# Patient Record
Sex: Male | Born: 1985 | Race: Black or African American | Hispanic: No | Marital: Single | Smoking: Former smoker
Health system: Southern US, Community
[De-identification: ages and names within clinical notes are randomized; demographics above are authoritative.]

---
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
## 2003-06-15 ENCOUNTER — Emergency Department (HOSPITAL_COMMUNITY): Admission: AD | Admit: 2003-06-15 | Discharge: 2003-06-15 | Payer: Self-pay | Admitting: Family Medicine

## 2005-04-25 ENCOUNTER — Emergency Department (HOSPITAL_COMMUNITY): Admission: EM | Admit: 2005-04-25 | Discharge: 2005-04-25 | Payer: Self-pay | Admitting: Family Medicine

## 2005-05-11 ENCOUNTER — Emergency Department (HOSPITAL_COMMUNITY): Admission: EM | Admit: 2005-05-11 | Discharge: 2005-05-11 | Payer: Self-pay | Admitting: Family Medicine

## 2005-07-16 ENCOUNTER — Emergency Department (HOSPITAL_COMMUNITY): Admission: EM | Admit: 2005-07-16 | Discharge: 2005-07-16 | Payer: Self-pay | Admitting: Family Medicine

## 2005-09-03 ENCOUNTER — Emergency Department (HOSPITAL_COMMUNITY): Admission: EM | Admit: 2005-09-03 | Discharge: 2005-09-03 | Payer: Self-pay | Admitting: Family Medicine

## 2007-03-27 ENCOUNTER — Emergency Department (HOSPITAL_COMMUNITY): Admission: EM | Admit: 2007-03-27 | Discharge: 2007-03-27 | Payer: Self-pay | Admitting: Emergency Medicine

## 2008-11-15 ENCOUNTER — Emergency Department (HOSPITAL_COMMUNITY): Admission: EM | Admit: 2008-11-15 | Discharge: 2008-11-15 | Payer: Self-pay | Admitting: Emergency Medicine

## 2008-11-28 ENCOUNTER — Emergency Department (HOSPITAL_COMMUNITY): Admission: EM | Admit: 2008-11-28 | Discharge: 2008-11-28 | Payer: Self-pay | Admitting: Family Medicine

## 2011-02-19 ENCOUNTER — Emergency Department (HOSPITAL_COMMUNITY)
Admission: EM | Admit: 2011-02-19 | Discharge: 2011-02-19 | Disposition: A | Discharge status: Home / Self Care | Payer: Self-pay | Attending: Emergency Medicine | Admitting: Emergency Medicine

## 2011-02-19 ENCOUNTER — Emergency Department (HOSPITAL_COMMUNITY): Payer: Self-pay

## 2011-02-19 DIAGNOSIS — S52123A Displaced fracture of head of unspecified radius, initial encounter for closed fracture: Secondary | ICD-10-CM | POA: Insufficient documentation

## 2011-02-19 DIAGNOSIS — M25529 Pain in unspecified elbow: Secondary | ICD-10-CM | POA: Insufficient documentation

## 2012-03-02 ENCOUNTER — Emergency Department (HOSPITAL_COMMUNITY)
Admission: EM | Admit: 2012-03-02 | Discharge: 2012-03-02 | Disposition: A | Discharge status: Home / Self Care | Payer: Self-pay | Source: Home / Self Care

## 2012-03-02 ENCOUNTER — Encounter (HOSPITAL_COMMUNITY): Payer: Self-pay

## 2012-03-02 DIAGNOSIS — K047 Periapical abscess without sinus: Secondary | ICD-10-CM

## 2012-03-02 DIAGNOSIS — H109 Unspecified conjunctivitis: Secondary | ICD-10-CM

## 2012-03-02 MED ORDER — HYDROCODONE-ACETAMINOPHEN 5-325 MG PO TABS
1.0000 | ORAL_TABLET | Freq: Once | ORAL | Status: AC
  Administered 2012-03-02: 1 via ORAL

## 2012-03-02 MED ORDER — TETRACAINE HCL 0.5 % OP SOLN
OPHTHALMIC | Status: AC
  Filled 2012-03-02: qty 2

## 2012-03-02 MED ORDER — HYDROCODONE-ACETAMINOPHEN 5-325 MG PO TABS
ORAL_TABLET | ORAL | Status: AC
  Filled 2012-03-02: qty 1

## 2012-03-02 MED ORDER — PENICILLIN V POTASSIUM 500 MG PO TABS: 500.0000 mg | ORAL_TABLET | Freq: Four times a day (QID) | ORAL | Status: AC

## 2012-03-02 MED ORDER — TRAMADOL HCL 50 MG PO TABS: 50.0000 mg | ORAL_TABLET | Freq: Four times a day (QID) | ORAL | Status: AC | PRN

## 2012-03-02 NOTE — ED Notes (Signed)
C/o 2 dental abscesses for past week; had used motrin 800 w brief relief; also c/o pain swlling left eye

## 2012-03-02 NOTE — ED Provider Notes (Signed)
History     CSN: [id number]  Arrival date & time 03/02/12  1116   None     Chief Complaint  Patient presents with  . Dental Pain    (Consider location/radiation/quality/duration/timing/severity/associated sxs/prior treatment) Patient is a 26 y.o. male presenting with tooth pain. The history is provided by the patient.  Dental Pain Patient reports left eye redness and blurred vision for one week associated with discharge.  Reports no know injury or foreign body.  States has been using clear eyes with mild relief in symptoms.  Denies previous history of same.  Not diabetic, no previous eye surgery/trauma, denies cataracts or glaucoma. No floaters or flashing lights No vision loss + blurred vision + eye pain +photophobia + eyelid itching No tearing No headache/scalp tenderness Does not wear contacts, but wears glasses.   Bilateral upper and lower  tooth pain for 1 week, has taken home ibuprofen for pain with improvement. +Thermal sensitivity No fever - gingival bleeding No decreased intake of food/fluid     History reviewed. No pertinent past medical history.  History reviewed. No pertinent past surgical history.  History reviewed. No pertinent family history.  History  Substance Use Topics  . Smoking status: Not on file  . Smokeless tobacco: Not on file  . Alcohol Use: Not on file      Review of Systems  Constitutional: Negative.   HENT: Negative for neck pain, neck stiffness and postnasal drip.   Respiratory: Negative.   Cardiovascular: Negative.     Allergies  Review of patient's allergies indicates not on file.  Home Medications   Current Outpatient Rx  Name Route Sig Dispense Refill  . PENICILLIN V POTASSIUM 500 MG PO TABS Oral Take 1 tablet (500 mg total) by mouth 4 (four) times daily. 40 tablet 0  . TRAMADOL HCL 50 MG PO TABS Oral Take 1 tablet (50 mg total) by mouth every 6 (six) hours as needed for pain. 15 tablet 0    BP 120/67  Pulse 70   Temp 98.3 F (36.8 C)  Resp 16  SpO2 100%  Physical Exam  Nursing note and vitals reviewed. Constitutional: He is oriented to person, place, and time. Vital signs are normal. He appears well-developed and well-nourished. He is active and cooperative.  HENT:  Head: Normocephalic. No trismus in the jaw.  Right Ear: Hearing, tympanic membrane, external ear and ear canal normal.  Left Ear: Hearing, tympanic membrane, external ear and ear canal normal.  Nose: Right sinus exhibits no maxillary sinus tenderness and no frontal sinus tenderness. Left sinus exhibits maxillary sinus tenderness. Left sinus exhibits no frontal sinus tenderness.  Mouth/Throat: Oropharynx is clear and moist and mucous membranes are normal. He does not have dentures. Abnormal dentition. Dental abscesses and dental caries present. No uvula swelling.  Eyes: Pupils are equal, round, and reactive to light. Right eye exhibits no chemosis, no discharge, no exudate and no hordeolum. No foreign body present in the right eye. Left eye exhibits no chemosis, no discharge, no exudate and no hordeolum. No foreign body present in the left eye. Right conjunctiva is not injected. Right conjunctiva has no hemorrhage. Left conjunctiva is injected. Left conjunctiva has no hemorrhage. No scleral icterus.       Tetracaine and fluoroscien applied to left eye, examined with woods lamp. No corneal abrasion noted, no foreign body found.  Pt tolerated procedure well.  Neck: Trachea normal. Neck supple.  Cardiovascular: Normal rate, regular rhythm, normal heart sounds and normal pulses.  Pulmonary/Chest: Effort normal and breath sounds normal.  Lymphadenopathy:       Head (right side): No submental, no submandibular, no tonsillar, no preauricular, no posterior auricular and no occipital adenopathy present.       Head (left side): No submental, no submandibular, no tonsillar, no preauricular, no posterior auricular and no occipital adenopathy present.      He has no cervical adenopathy.  Neurological: He is alert and oriented to person, place, and time. No cranial nerve deficit or sensory deficit.  Skin: Skin is warm and dry.  Psychiatric: He has a normal mood and affect. His speech is normal and behavior is normal. Judgment and thought content normal. Cognition and memory are normal.    ED Course  Procedures (including critical care time)  Labs Reviewed - No data to display No results found.   1. Conjunctivitis, left eye   2. Dental abscess       MDM  Apply warm compresses four times daily. Discontinue the use of eye makeup as well as eye lotions and creams because they may be infected, obtain new. Discard your current contact lenses (if applicable) to decrease re-infection.  Seek follow-up care with an ophthalmologist within 1-2 weeks if the condition is not resolved completely with prescribed management.  Find a dentist to handle your dental issues, take antibiotics as prescribed.           Johnsie Kindred, NP 03/02/12 1259

## 2012-03-03 NOTE — ED Provider Notes (Signed)
Medical screening examination/treatment/procedure(s) were performed by resident physician or non-physician practitioner and as supervising physician I was immediately available for consultation/collaboration.   KINDL,JAMES DOUGLAS MD.    James D Kindl, MD 03/03/12 2013 

## 2014-05-10 ENCOUNTER — Encounter (HOSPITAL_COMMUNITY): Payer: Self-pay | Admitting: Emergency Medicine

## 2014-05-10 ENCOUNTER — Emergency Department (HOSPITAL_COMMUNITY)
Admission: EM | Admit: 2014-05-10 | Discharge: 2014-05-10 | Discharge status: Against Medical Advice | Payer: Self-pay | Attending: Emergency Medicine | Admitting: Emergency Medicine

## 2014-05-10 DIAGNOSIS — S61511A Laceration without foreign body of right wrist, initial encounter: Secondary | ICD-10-CM | POA: Insufficient documentation

## 2014-05-10 DIAGNOSIS — Z23 Encounter for immunization: Secondary | ICD-10-CM | POA: Insufficient documentation

## 2014-05-10 DIAGNOSIS — Z720 Tobacco use: Secondary | ICD-10-CM | POA: Insufficient documentation

## 2014-05-10 DIAGNOSIS — Y9389 Activity, other specified: Secondary | ICD-10-CM | POA: Insufficient documentation

## 2014-05-10 DIAGNOSIS — Y9289 Other specified places as the place of occurrence of the external cause: Secondary | ICD-10-CM | POA: Insufficient documentation

## 2014-05-10 DIAGNOSIS — W260XXA Contact with knife, initial encounter: Secondary | ICD-10-CM | POA: Insufficient documentation

## 2014-05-10 MED ORDER — TETANUS-DIPHTH-ACELL PERTUSSIS 5-2.5-18.5 LF-MCG/0.5 IM SUSP
0.5000 mL | Freq: Once | INTRAMUSCULAR | Status: AC
  Administered 2014-05-10: 0.5 mL via INTRAMUSCULAR
  Filled 2014-05-10: qty 0.5

## 2014-05-10 MED ORDER — HYDROCODONE-ACETAMINOPHEN 5-325 MG PO TABS
2.0000 | ORAL_TABLET | Freq: Once | ORAL | Status: AC
  Administered 2014-05-10: 2 via ORAL
  Filled 2014-05-10: qty 2
  Filled 2014-05-10: qty 1

## 2014-05-10 MED ORDER — LIDOCAINE-EPINEPHRINE 1 %-1:100000 IJ SOLN
10.0000 mL | Freq: Once | INTRAMUSCULAR | Status: AC
  Administered 2014-05-10: 10 mL
  Filled 2014-05-10: qty 1

## 2014-05-10 NOTE — ED Notes (Signed)
Pt has lac to right wrist. Bleeding controlled. Denies tetanus.
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
## 2014-05-10 NOTE — ED Provider Notes (Signed)
Medical screening examination/treatment/procedure(s) were performed by non-physician practitioner and as supervising physician I was immediately available for consultation/collaboration.   EKG Interpretation None       Shivam Mestas, MD 05/10/14 1637 

## 2014-05-10 NOTE — ED Notes (Signed)
Patient and friend came back to front desk and advised he changed mind and is back.   Then he stated he is good and left again.
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
## 2015-07-10 ENCOUNTER — Encounter (HOSPITAL_COMMUNITY): Payer: Self-pay | Admitting: Emergency Medicine

## 2015-07-10 ENCOUNTER — Emergency Department (HOSPITAL_COMMUNITY)
Admission: EM | Admit: 2015-07-10 | Discharge: 2015-07-11 | Disposition: A | Discharge status: Home / Self Care | Payer: Medicaid Other | Attending: Emergency Medicine | Admitting: Emergency Medicine

## 2015-07-10 DIAGNOSIS — Z008 Encounter for other general examination: Secondary | ICD-10-CM | POA: Diagnosis present

## 2015-07-10 DIAGNOSIS — F1210 Cannabis abuse, uncomplicated: Secondary | ICD-10-CM | POA: Diagnosis not present

## 2015-07-10 DIAGNOSIS — F17200 Nicotine dependence, unspecified, uncomplicated: Secondary | ICD-10-CM | POA: Insufficient documentation

## 2015-07-10 DIAGNOSIS — F1510 Other stimulant abuse, uncomplicated: Secondary | ICD-10-CM | POA: Diagnosis not present

## 2015-07-10 DIAGNOSIS — F1910 Other psychoactive substance abuse, uncomplicated: Secondary | ICD-10-CM

## 2015-07-10 LAB — COMPREHENSIVE METABOLIC PANEL
ALT: 32 U/L (ref 17–63)
AST: 39 U/L (ref 15–41)
Albumin: 4.3 g/dL (ref 3.5–5.0)
Alkaline Phosphatase: 78 U/L (ref 38–126)
Anion gap: 9 (ref 5–15)
BUN: 16 mg/dL (ref 6–20)
CO2: 30 mmol/L (ref 22–32)
Calcium: 9.2 mg/dL (ref 8.9–10.3)
Chloride: 100 mmol/L — ABNORMAL LOW (ref 101–111)
Creatinine, Ser: 0.93 mg/dL (ref 0.61–1.24)
GFR calc Af Amer: >60 mL/min (ref >60)
GFR calc non Af Amer: >60 mL/min (ref >60)
Glucose, Bld: 134 mg/dL — ABNORMAL HIGH (ref 65–99)
Potassium: 3.9 mmol/L (ref 3.5–5.1)
Sodium: 139 mmol/L (ref 135–145)
Total Bilirubin: 1.2 mg/dL (ref 0.3–1.2)
Total Protein: 7.1 g/dL (ref 6.5–8.1)

## 2015-07-10 LAB — CBC
HCT: 40.6 % (ref 39.0–52.0)
Hemoglobin: 13.4 g/dL (ref 13.0–17.0)
MCH: 27.7 pg (ref 26.0–34.0)
MCHC: 33 g/dL (ref 30.0–36.0)
MCV: 84.1 fL (ref 78.0–100.0)
Platelets: 249 10*3/uL (ref 150–400)
RBC: 4.83 MIL/uL (ref 4.22–5.81)
RDW: 14.1 % (ref 11.5–15.5)
WBC: 9.7 10*3/uL (ref 4.0–10.5)

## 2015-07-10 LAB — ETHANOL: Alcohol, Ethyl (B): <5 mg/dL (ref <5)

## 2015-07-10 NOTE — ED Notes (Signed)
Patient reports ETOH, molly and marijuana on board. Denies any c/c. Lethargic during triage. Cooperativen in triage.

## 2015-07-10 NOTE — ED Notes (Signed)
Bed: WTR6 Expected date:  Expected time:  Means of arrival:  Comments: EMS 29 yo male substance abuse-wants a psych eval

## 2015-07-10 NOTE — ED Notes (Signed)
Patient presents from home via EMS for medical clearance. Family reports patient was not acting to baseline and want him evaluated. Per EMS patient hasn't slept in three days and has been using ETOH, marijuana, and molly.  Last VS: 118, 78hr, [id number]

## 2015-07-11 NOTE — ED Provider Notes (Signed)
CSN: [id number]     Arrival date & time 07/10/15  2241 History   First MD Initiated Contact with Patient 07/11/15 0138     Chief Complaint  Patient presents with  . Medical Clearance     (Consider location/radiation/quality/duration/timing/severity/associated sxs/prior Treatment) The history is provided by the patient and medical records.   29 year old male without past medical history presenting to the ED for medical clearance at the insistence of his family. Patient states over the Christmas holiday he was smoking marijuana, taking Molly, and drinking large amounts of marijuana. He states he does this regularly, however his family is usually not around so they don't notice his behavior. He states when he uses drugs and drinks alcohol heavily he stays up for days on and which is not abnormal.  He states since his emergency department he has been able to sleep and states he feels fine. He states he last used drugs 2 days ago. No alcohol for the past 2 days either. Patient denies any suicidal or homicidal ideation. He denies any auditory or visual hallucinations.  No psychiatric history.  Denies any physical complaints at this time.  VSS.  History reviewed. No pertinent past medical history. History reviewed. No pertinent past surgical history. No family history on file. Social History  Substance Use Topics  . Smoking status: Light Tobacco Smoker  . Smokeless tobacco: None  . Alcohol Use: No    Review of Systems  Psychiatric/Behavioral:       Substance abuse  All other systems reviewed and are negative.     Allergies  Review of patient's allergies indicates no known allergies.  Home Medications   Prior to Admission medications   Not on File   BP 144/83 mmHg  Pulse 98  Temp(Src) 98.6 F (37 C) (Oral)  Resp 18  SpO2 97%   Physical Exam  Constitutional: He is oriented to person, place, and time. He appears well-developed and well-nourished. No distress.  HENT:  Head:  Normocephalic and atraumatic.  Mouth/Throat: Oropharynx is clear and moist.  Eyes: Conjunctivae and EOM are normal. Pupils are equal, round, and reactive to light.  Neck: Normal range of motion. Neck supple.  Cardiovascular: Normal rate, regular rhythm and normal heart sounds.   Pulmonary/Chest: Effort normal and breath sounds normal. No respiratory distress. He has no wheezes.  Abdominal: Soft. Bowel sounds are normal. There is no tenderness. There is no guarding.  Musculoskeletal: Normal range of motion. He exhibits no edema.  Neurological: He is alert and oriented to person, place, and time.  Skin: Skin is warm and dry. He is not diaphoretic.  Psychiatric: He has a normal mood and affect. He is not withdrawn and not actively hallucinating. He expresses no homicidal and no suicidal ideation. He expresses no suicidal plans and no homicidal plans.  Nursing note and vitals reviewed.   ED Course  Procedures (including critical care time) Labs Review Labs Reviewed  COMPREHENSIVE METABOLIC PANEL - Abnormal; Notable for the following:    Chloride 100 (*)    Glucose, Bld 134 (*)    All other components within normal limits  ETHANOL  CBC    Imaging Review No results found. I have personally reviewed and evaluated these images and lab results as part of my medical decision-making.   EKG Interpretation None      MDM   Final diagnoses:  Polysubstance abuse   29 year old male here with substance abuse.  He has specifically been drinking alcohol, taking Molly, and smoking marijuana.  Patient is afebrile, nontoxic. Patient states he has a long history of abusing all 3 substances, however his family is usually not around when doing so so they don't notice his behavior. He states he feels fine. He denies any suicidal or homicidal ideation. No auditory or visual hallucinations.  Labs area reassuring.  UDS was not obtained as patient could not provide urine sample here, however he opens admits  to polysubstance abuse. Patient has no psychiatric history does not appear to be a danger to himself or others. I have offered him outpatient resources for AA or drug rehabilitation, he states he will think about it.  Patient stable for discharge.  He will return here for any new/worsening symptoms.  Garlon HatchetLisa M Chukwuemeka Artola, PA-C 07/11/15 [id number]  Alvira MondayErin Schlossman, MD 07/11/15 2141

## 2015-07-11 NOTE — ED Notes (Addendum)
Patient made aware that urine sample is needed. Patient states he is unable to void at this time. Patient encouraged to void when able. Denies SI/HI.

## 2015-07-11 NOTE — ED Notes (Signed)
Discharge instructions and follow up care reviewed with patient. Patient verbalized understanding. Patient given a bus pass in order to get home. Patient attempted to call a ride.

## 2015-07-11 NOTE — Discharge Instructions (Signed)
°Emergency Department Resource Guide °1) Find a Doctor and Pay Out of Pocket °Although you won't have to find out who is covered by your insurance plan, it is a good idea to ask around and get recommendations. You will then need to call the office and see if the doctor you have chosen will accept you as a new patient and what types of options they offer for patients who are self-pay. Some doctors offer discounts or will set up payment plans for their patients who do not have insurance, but you will need to ask so you aren't surprised when you get to your appointment. ° °2) Contact Your Local Health Department °Not all health departments have doctors that can see patients for sick visits, but many do, so it is worth a call to see if yours does. If you don't know where your local health department is, you can check in your phone book. The CDC also has a tool to help you locate your state's health department, and many state websites also have listings of all of their local health departments. ° °3) Find a Walk-in Clinic °If your illness is not likely to be very severe or complicated, you may want to try a walk in clinic. These are popping up all over the country in pharmacies, drugstores, and shopping centers. They're usually staffed by nurse practitioners or physician assistants that have been trained to treat common illnesses and complaints. They're usually fairly quick and inexpensive. However, if you have serious medical issues or chronic medical problems, these are probably not your best option. ° °No Primary Care Doctor: °- Call [facility] at  832-8000 - they can help you locate a primary care doctor that  accepts your insurance, provides certain services, etc. °- Physician Referral Service- 1-800-533-3463 ° °Chronic Pain Problems: °Organization         Address  Phone   Notes  °[facility]  (336) 297-2271 Patients need to be referred by their primary care doctor.  ° °Medication  Assistance: °Organization         Address  Phone   Notes  °[place] [facility] [address] °[place], [place] [address] (336) 641-8030 --Must be a resident of [place] °-- Must have NO insurance coverage whatsoever (no Medicaid/ Medicare, etc.) °-- The pt. MUST have a primary care doctor that directs their care regularly and follows them in the community °  °[facility]  (866) 331-1348   °[employer]  (888) 892-1162   ° °Agencies that provide inexpensive medical care: °Organization         Address  Phone   Notes  °[facility]  (336) 832-8035   °[facility]    (336) 832-7272   °[facility] [address] °[place], [place] [address] (336) 832-4777   °[facility] [address], °[place] (336) 271-4999   °[facility]    (336) 373-0678   °[facility]    (336) 272-1050   °[facility] ° [address], [place] Phone:  (336) 832-4444, Fax:  (336) 832-4440 Hours of Operation:  9 am - 6 pm, M-F.  Also accepts Medicaid/Medicare and self-pay.  °[facility] ° [address], [place] Phone: (336) 832-3150, Fax: (336) 832-3151. Hours of Operation:  8:30 am - 5:30 pm, M-F.  Also accepts Medicaid and self-pay.  °[facility] [place] [address], [place] Phone: (336) 878-6027   °[facility] [address], [place], [place] (336)723-1848, Ext. 123 Mondays & Thursdays: 7-9 AM.  First 15 patients are seen on a first come, first serve basis. °  ° °Medicaid-accepting [place] Providers: ° °Organization         Address  Phone   Notes  °[facility] [address], [place] (336) 641-2100 Also accepts self-pay patients.  °[facility] [address], [place] ° (336) 856-9996   °[facility] [address], [place]  (336) 288-8857   °[facility] [address] [place] [address], [place] (336) 299-7000   °Veita Bland [address], [place]  ° (336) 373-1557 Only accepts [place] Access Medicaid patients after they have their name applied to their card.  ° °Self-Pay (no insurance) in [place]: ° °Organization         Address  Phone   Notes  °Sickle Cell Patients, [facility] [address], [place] (336) 832-1970   °[facility] [address], [place] (336) 832-4400   °[facility] ° [address], [place] (336) 992-4800   °[facility]/Dr. Osei-Bonsu ° [address] [place] [address], [place] or [address], [place] (336) 841-8500 Phone number for both [place] and [place] locations is the same.  °[facility] [address], [place] (336) 299-0000   °[facility] [address] [place] [address], [place] or [address] (336) 852-7530 °(336) 878-2260   °[facility] [address], [place] (336) 350-1642, phone; (336) 294-5005, fax Sees patients 1st and 3rd Saturday of every month.  Must not qualify for public or private insurance (i.e. Medicaid, Medicare, [place] Health Choice, Veterans' Benefits) • Household income should be no more than 200% of the poverty level •The clinic cannot treat you if you are pregnant or think you are pregnant • Sexually transmitted diseases are not treated at the clinic.  ° ° °Dental Care: °Organization         Address  Phone  Notes  °[place] [facility] [address], [place] (336) 641-6152 Accepts children up to age 21 who are enrolled in Medicaid or [place] Health Choice; pregnant women with a Medicaid card; and children who have applied for Medicaid or [place] Health Choice, but were declined, whose parents can pay a reduced fee at time of service.  °[place]  [facility] [place]  [address], [place] (336) 641-7733 Accepts children up to age 21 who are enrolled in Medicaid or [place] Health Choice; pregnant women with a Medicaid card; and children who have applied for Medicaid or  Health Choice, but were declined, whose parents can pay a reduced fee at time of service.  °[facility] ° [address],  (336) 641-4533 Patients are seen by appointment only. Walk-ins are not accepted. [facility] will see patients 18 years of age and older. °Monday - Tuesday (8am-5pm) °Most Wednesdays (8:30-5pm) °$30 per visit, cash only  °[facility] ° [address], [place] (336) 641-4533 Patients are seen by appointment only. Walk-ins are not accepted. [facility] will see patients 18 years of age and older. °One   Wednesday Evening (Monthly: Volunteer Based).  $30 per visit, cash only  °[facility]  (919) 537-3737 for adults; Children under age 4, call Graduate Pediatric Dentistry at (919) [phone]. Children aged 4-14, please call (919) 537-3737 to request a pediatric application. ° Dental services are provided in all areas of dental care including fillings, crowns and bridges, complete and partial dentures, implants, gum treatment, root canals, and extractions. Preventive care is also provided. Treatment is provided to both adults and children. °Patients are selected via a lottery and there is often a waiting list. °  °[facility] [address], °[place] ° (336) 763-8833 [email or web address] °  °[facility] [address], [place], [place] (336)723-1848, Ext. 123 Second and Fourth Thursday of each month, opens at 6:30 AM; Clinic ends at 9 AM.  Patients are seen on a first-come first-served basis, and a limited number are seen during each clinic.  ° °[facility] ° [address], [place], [place] (336) 723-7904    Eligibility Requirements °You must have lived in [place], or [place] counties for at least the last three months. °  You cannot be eligible for state or federal sponsored healthcare insurance, including Veterans Administration, Medicaid, or Medicare. °  You generally cannot be eligible for healthcare insurance through your employer.  °  How to apply: °Eligibility screenings are held every Tuesday and Wednesday afternoon from 1:00 pm until 4:00 pm. You do not need an appointment for the interview!  °[facility] [address], [place], [place] 336-631-2330   °[facility]  336-342-8273   °[facility]  336-703-3100   °[facility]  336-570-6415   ° °Behavioral Health Resources in the Community: °Intensive Outpatient Programs °Organization         Address  Phone  Notes  °[facility] [address], [place], [place] 336-878-6098   °[facility] [address], [place], [place] 336-832-9800   °[facility] [address], [place], [place] ° 336-882-2125   °[facility] [address],  °[place], [place] 1-800-853-5163 or 336-641-4981   °Substance Abuse Resources °Organization         Address  Phone  Notes  °[facility]  336-882-2125   °[facility]  336-784-9470   °[facility]  336-285-9073   °[facility]  336-845-3988   °Residential & Outpatient Substance Abuse Program  1-800-659-3381   °Psychological Services °Organization         Address  Phone  Notes  °[facility]  336- 832-9600   °[facility]  336- 378-7881   °[facility] [address], [place] 1-800-853-5163 or 336-641-4981   ° °Mobile Crisis Teams °Organization         Address  Phone  Notes  °[facility], Mobile Crisis Care Unit  1-877-626-1772   °Assertive °[facility] ° [address]  [place], [place] 336-834-9664   °Sharon DeEsch [address] °[place] 336-554-5454   ° °Self-Help/Support Groups °Organization         Address  Phone             Notes  °[facility] - variety of support groups  336- 373-1402 Call for more information  °Narcotics Anonymous (NA), [facility] [address], °[place]   2 meetings at this location  ° °  Residential Treatment Programs °Organization         Address  Phone  Notes  °[facility] [address],    °[place]  1-866-801-8205   °[facility] ° [address], [place], [place] 704-293-8524   °[facility] [address], [place] 336-845-3988 Admissions: 8am-3pm M-F  °[facility] [address]    °[place], [place] 336-841-1104   °[facility] [address], [place], [place] 336-379-7146   °[facility] [address]  °[place], [place] 336-285-9073   °[facility] - Intensive Outpatient [address], [place], [place] 336-852-3033   °[facility] [address]  °[place], [place] 1-877-615-2722 or 336-784-9470   °[facility] [address] [place], [place] 336-227-7417 Accepts Medicaid  °[facility] [address]  °[place] 1-800-659-3381 Substance Abuse/Addiction Treatment  ° °[place] Behavioral Health Resources °Organization         Address  Phone  Notes  °[facility]  (888) 581-9988   °Julie Brannon, PhD [address] [place], [place]   (336) 349-5553 or (336) 951-0000   °[facility]   [address] °[place], [place] (336) 349-4454   °[facility] [address], [place], [place] (336) 342-8316 Insurance/Medicaid/sponsorship through [facility]  °[facility] [address]                                    [place], [place] (336) 342-8316 Therapy/tele-psych/case    °[facility] [address]  ° [place], [place] (336) 349-2233    °Dr. Arfeen  (336) 349-4544   °[facility] [place]  [employer] [place] [facility] 1) [address], [place] °2) [address], [place] °3)  [address], [place] (336) 349-3220 °(336) 342-7768 ° °(336) 342-8140   °[place] [facility] (336) 342-1394 or (336) 342-3537 (After Hours)    ° ° °

## 2015-09-01 ENCOUNTER — Emergency Department (HOSPITAL_COMMUNITY): Payer: Medicaid Other

## 2015-09-01 ENCOUNTER — Encounter (HOSPITAL_COMMUNITY): Payer: Self-pay | Admitting: *Deleted

## 2015-09-01 ENCOUNTER — Emergency Department (HOSPITAL_COMMUNITY)
Admission: EM | Admit: 2015-09-01 | Discharge: 2015-09-01 | Disposition: A | Discharge status: Home / Self Care | Payer: Medicaid Other | Attending: Physician Assistant | Admitting: Physician Assistant

## 2015-09-01 DIAGNOSIS — S31030A Puncture wound without foreign body of lower back and pelvis without penetration into retroperitoneum, initial encounter: Secondary | ICD-10-CM | POA: Diagnosis not present

## 2015-09-01 DIAGNOSIS — Y9389 Activity, other specified: Secondary | ICD-10-CM | POA: Diagnosis not present

## 2015-09-01 DIAGNOSIS — S01332A Puncture wound without foreign body of left ear, initial encounter: Secondary | ICD-10-CM | POA: Diagnosis not present

## 2015-09-01 DIAGNOSIS — Y9289 Other specified places as the place of occurrence of the external cause: Secondary | ICD-10-CM | POA: Insufficient documentation

## 2015-09-01 DIAGNOSIS — S20212A Contusion of left front wall of thorax, initial encounter: Secondary | ICD-10-CM | POA: Diagnosis not present

## 2015-09-01 DIAGNOSIS — Y998 Other external cause status: Secondary | ICD-10-CM | POA: Insufficient documentation

## 2015-09-01 DIAGNOSIS — S29001A Unspecified injury of muscle and tendon of front wall of thorax, initial encounter: Secondary | ICD-10-CM | POA: Diagnosis present

## 2015-09-01 DIAGNOSIS — T07XXXA Unspecified multiple injuries, initial encounter: Secondary | ICD-10-CM

## 2015-09-01 DIAGNOSIS — F17200 Nicotine dependence, unspecified, uncomplicated: Secondary | ICD-10-CM | POA: Insufficient documentation

## 2015-09-01 DIAGNOSIS — S1081XA Abrasion of other specified part of neck, initial encounter: Secondary | ICD-10-CM | POA: Insufficient documentation

## 2015-09-01 DIAGNOSIS — S41132A Puncture wound without foreign body of left upper arm, initial encounter: Secondary | ICD-10-CM | POA: Diagnosis not present

## 2015-09-01 MED ORDER — BACITRACIN ZINC 500 UNIT/GM EX OINT
TOPICAL_OINTMENT | Freq: Two times a day (BID) | CUTANEOUS | Status: DC
  Administered 2015-09-01: 22:00:00 via TOPICAL
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
## 2015-09-01 NOTE — Discharge Instructions (Signed)
Clean your wounds with soap and warm water and apply neosporin ointment to the wounds. Return as needed for any problems.

## 2015-09-01 NOTE — ED Notes (Signed)
Patient is alert and orientedx4.  Patient was explained discharge instructions and they understood them with no questions.   

## 2015-10-04 ENCOUNTER — Encounter (HOSPITAL_COMMUNITY): Payer: Self-pay | Admitting: Emergency Medicine

## 2015-10-04 ENCOUNTER — Emergency Department (HOSPITAL_COMMUNITY)
Admission: EM | Admit: 2015-10-04 | Discharge: 2015-10-04 | Disposition: A | Discharge status: Home / Self Care | Payer: Medicaid Other | Attending: Emergency Medicine | Admitting: Emergency Medicine

## 2015-10-04 DIAGNOSIS — F22 Delusional disorders: Secondary | ICD-10-CM | POA: Diagnosis present

## 2015-10-04 DIAGNOSIS — F1510 Other stimulant abuse, uncomplicated: Secondary | ICD-10-CM | POA: Insufficient documentation

## 2015-10-04 DIAGNOSIS — F1910 Other psychoactive substance abuse, uncomplicated: Secondary | ICD-10-CM

## 2015-10-04 DIAGNOSIS — F17200 Nicotine dependence, unspecified, uncomplicated: Secondary | ICD-10-CM | POA: Diagnosis not present
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
## 2015-10-06 ENCOUNTER — Encounter (HOSPITAL_COMMUNITY): Payer: Self-pay

## 2015-10-06 ENCOUNTER — Emergency Department (HOSPITAL_COMMUNITY)
Admission: EM | Admit: 2015-10-06 | Discharge: 2015-10-07 | Disposition: A | Discharge status: Home / Self Care | Payer: Medicaid Other | Attending: Emergency Medicine | Admitting: Emergency Medicine

## 2015-10-06 DIAGNOSIS — S60512A Abrasion of left hand, initial encounter: Secondary | ICD-10-CM | POA: Diagnosis not present

## 2015-10-06 DIAGNOSIS — F22 Delusional disorders: Secondary | ICD-10-CM | POA: Diagnosis not present

## 2015-10-06 DIAGNOSIS — Y9289 Other specified places as the place of occurrence of the external cause: Secondary | ICD-10-CM | POA: Insufficient documentation

## 2015-10-06 DIAGNOSIS — F1210 Cannabis abuse, uncomplicated: Secondary | ICD-10-CM | POA: Insufficient documentation

## 2015-10-06 DIAGNOSIS — F1910 Other psychoactive substance abuse, uncomplicated: Secondary | ICD-10-CM

## 2015-10-06 DIAGNOSIS — F419 Anxiety disorder, unspecified: Secondary | ICD-10-CM | POA: Insufficient documentation

## 2015-10-06 DIAGNOSIS — F17200 Nicotine dependence, unspecified, uncomplicated: Secondary | ICD-10-CM | POA: Diagnosis not present

## 2015-10-06 DIAGNOSIS — S60511A Abrasion of right hand, initial encounter: Secondary | ICD-10-CM | POA: Insufficient documentation

## 2015-10-06 DIAGNOSIS — F1220 Cannabis dependence, uncomplicated: Secondary | ICD-10-CM

## 2015-10-06 DIAGNOSIS — Y288XXA Contact with other sharp object, undetermined intent, initial encounter: Secondary | ICD-10-CM | POA: Insufficient documentation

## 2015-10-06 DIAGNOSIS — Y9302 Activity, running: Secondary | ICD-10-CM | POA: Insufficient documentation

## 2015-10-06 DIAGNOSIS — G4700 Insomnia, unspecified: Secondary | ICD-10-CM | POA: Diagnosis not present

## 2015-10-06 DIAGNOSIS — Y998 Other external cause status: Secondary | ICD-10-CM | POA: Diagnosis not present

## 2015-10-06 DIAGNOSIS — F1610 Hallucinogen abuse, uncomplicated: Secondary | ICD-10-CM
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
## 2015-10-07 DIAGNOSIS — F1610 Hallucinogen abuse, uncomplicated: Secondary | ICD-10-CM

## 2015-10-07 DIAGNOSIS — F1510 Other stimulant abuse, uncomplicated: Secondary | ICD-10-CM

## 2015-10-07 DIAGNOSIS — F1220 Cannabis dependence, uncomplicated: Secondary | ICD-10-CM

## 2015-10-07 LAB — COMPREHENSIVE METABOLIC PANEL
ALK PHOS: 63 U/L (ref 38–126)
ALT: 55 U/L (ref 17–63)
ANION GAP: 12 (ref 5–15)
AST: 86 U/L — ABNORMAL HIGH (ref 15–41)
Albumin: 3.9 g/dL (ref 3.5–5.0)
BILIRUBIN TOTAL: 1.6 mg/dL — AB (ref 0.3–1.2)
BUN: 8 mg/dL (ref 6–20)
CALCIUM: 9.1 mg/dL (ref 8.9–10.3)
CO2: 24 mmol/L (ref 22–32)
Chloride: 102 mmol/L (ref 101–111)
Creatinine, Ser: 1.14 mg/dL (ref 0.61–1.24)
GFR calc Af Amer: >60 mL/min (ref >60)
Glucose, Bld: 109 mg/dL — ABNORMAL HIGH (ref 65–99)
POTASSIUM: 3.3 mmol/L — AB (ref 3.5–5.1)
Sodium: 138 mmol/L (ref 135–145)
TOTAL PROTEIN: 6.2 g/dL — AB (ref 6.5–8.1)

## 2015-10-07 LAB — ETHANOL

## 2015-10-07 LAB — CBC WITH DIFFERENTIAL/PLATELET
BASOS ABS: 0 10*3/uL (ref 0.0–0.1)
BASOS PCT: 0 %
EOS PCT: 1 %
Eosinophils Absolute: 0.2 10*3/uL (ref 0.0–0.7)
HCT: 40.6 % (ref 39.0–52.0)
Hemoglobin: 12.7 g/dL — ABNORMAL LOW (ref 13.0–17.0)
Lymphocytes Relative: 8 %
Lymphs Abs: 0.9 10*3/uL (ref 0.7–4.0)
MCH: 26.4 pg (ref 26.0–34.0)
MCHC: 31.3 g/dL (ref 30.0–36.0)
MCV: 84.4 fL (ref 78.0–100.0)
MONO ABS: 0.9 10*3/uL (ref 0.1–1.0)
Monocytes Relative: 8 %
Neutro Abs: 9.1 10*3/uL — ABNORMAL HIGH (ref 1.7–7.7)
Neutrophils Relative %: 83 %
Platelets: 259 10*3/uL (ref 150–400)
RBC: 4.81 MIL/uL (ref 4.22–5.81)
RDW: 14.5 % (ref 11.5–15.5)
WBC: 11.1 10*3/uL — ABNORMAL HIGH (ref 4.0–10.5)

## 2015-10-07 LAB — RAPID URINE DRUG SCREEN, HOSP PERFORMED
Amphetamines: NOT DETECTED
BARBITURATES: NOT DETECTED
BENZODIAZEPINES: NOT DETECTED
COCAINE: NOT DETECTED
Opiates: NOT DETECTED
TETRAHYDROCANNABINOL: POSITIVE — AB

## 2015-10-07 MED ORDER — BACITRACIN ZINC 500 UNIT/GM EX OINT
TOPICAL_OINTMENT | Freq: Once | CUTANEOUS | Status: AC
  Administered 2015-10-07: 12:00:00 via TOPICAL
  Filled 2015-10-07: qty 0.9

## 2015-10-07 NOTE — ED Notes (Signed)
TTS machine placed in room. 

## 2015-10-07 NOTE — ED Provider Notes (Signed)
Results for orders placed or performed during the hospital encounter of 10/06/15  Urine rapid drug screen (hosp performed)  Result Value Ref Range   Opiates NONE DETECTED NONE DETECTED   Cocaine NONE DETECTED NONE DETECTED   Benzodiazepines NONE DETECTED NONE DETECTED   Amphetamines NONE DETECTED NONE DETECTED   Tetrahydrocannabinol POSITIVE (A) NONE DETECTED   Barbiturates NONE DETECTED NONE DETECTED  CBC with Differential/Platelet  Result Value Ref Range   WBC 11.1 (H) 4.0 - 10.5 K/uL   RBC 4.81 4.22 - 5.81 MIL/uL   Hemoglobin 12.7 (L) 13.0 - 17.0 g/dL   HCT 04.540.6 40.939.0 - 81.152.0 %   MCV 84.4 78.0 - 100.0 fL   MCH 26.4 26.0 - 34.0 pg   MCHC 31.3 30.0 - 36.0 g/dL   RDW 91.414.5 78.211.5 - 95.615.5 %   Platelets 259 150 - 400 K/uL   Neutrophils Relative % 83 %   Neutro Abs 9.1 (H) 1.7 - 7.7 K/uL   Lymphocytes Relative 8 %   Lymphs Abs 0.9 0.7 - 4.0 K/uL   Monocytes Relative 8 %   Monocytes Absolute 0.9 0.1 - 1.0 K/uL   Eosinophils Relative 1 %   Eosinophils Absolute 0.2 0.0 - 0.7 K/uL   Basophils Relative 0 %   Basophils Absolute 0.0 0.0 - 0.1 K/uL  Comprehensive metabolic panel  Result Value Ref Range   Sodium 138 135 - 145 mmol/L   Potassium 3.3 (L) 3.5 - 5.1 mmol/L   Chloride 102 101 - 111 mmol/L   CO2 24 22 - 32 mmol/L   Glucose, Bld 109 (H) 65 - 99 mg/dL   BUN 8 6 - 20 mg/dL   Creatinine, Ser 2.131.14 0.61 - 1.24 mg/dL   Calcium 9.1 8.9 - 08.610.3 mg/dL   Total Protein 6.2 (L) 6.5 - 8.1 g/dL   Albumin 3.9 3.5 - 5.0 g/dL   AST 86 (H) 15 - 41 U/L   ALT 55 17 - 63 U/L   Alkaline Phosphatase 63 38 - 126 U/L   Total Bilirubin 1.6 (H) 0.3 - 1.2 mg/dL   GFR calc non Af Amer >60 >60 mL/min   GFR calc Af Amer >60 >60 mL/min   Anion gap 12 5 - 15  Ethanol  Result Value Ref Range   Alcohol, Ethyl (B) <5 <5 mg/dL   No results found. Pt has been evaluated by psych and cleared for discharge.  Return precautions given.  Psych and substance abuse resources given   Rolan BuccoMelanie Conall Vangorder, MD 10/07/15  1115
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
## 2015-10-07 NOTE — ED Notes (Signed)
Telepsych machine set up in room. Woke pt - eating breakfast.

## 2015-10-07 NOTE — ED Notes (Signed)
Spoke with Dr. Wilkie AyeHorton about the plan for patient, states that we can leave the patient in his street clothes for now and that psych is going to evaluate him again this morning
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
## 2015-10-07 NOTE — Discharge Instructions (Signed)
Polysubstance Abuse °When people abuse more than one drug or type of drug it is called polysubstance or polydrug abuse. For example, many smokers also drink alcohol. This is one form of polydrug abuse. Polydrug abuse also refers to the use of a drug to counteract an unpleasant effect produced by another drug. It may also be used to help with withdrawal from another drug. People who take stimulants may become agitated. Sometimes this agitation is countered with a tranquilizer. This helps protect against the unpleasant side effects. Polydrug abuse also refers to the use of different drugs at the same time.  °Anytime drug use is interfering with normal living activities, it has become abuse. This includes problems with family and friends. Psychological dependence has developed when your mind tells you that the drug is needed. This is usually followed by physical dependence which has developed when continuing increases of drug are required to get the same feeling or "high". This is known as addiction or chemical dependency. A person's risk is much higher if there is a history of chemical dependency in the family. °SIGNS OF CHEMICAL DEPENDENCY °· You have been told by friends or family that drugs have become a problem. °· You fight when using drugs. °· You are having blackouts (not remembering what you do while using). °· You feel sick from using drugs but continue using. °· You lie about use or amounts of drugs (chemicals) used. °· You need chemicals to get you going. °· You are suffering in work performance or in school because of drug use. °· You get sick from use of drugs but continue to use anyway. °· You need drugs to relate to people or feel comfortable in social situations. °· You use drugs to forget problems. °"Yes" answered to any of the above signs of chemical dependency indicates there are problems. The longer the use of drugs continues, the greater the problems will become. °If there is a family history of  drug or alcohol use, it is best not to experiment with these drugs. Continual use leads to tolerance. After tolerance develops more of the drug is needed to get the same feeling. This is followed by addiction. With addiction, drugs become the most important part of life. It becomes more important to take drugs than participate in the other usual activities of life. This includes relating to friends and family. Addiction is followed by dependency. Dependency is a condition where drugs are now needed not just to get high, but to feel normal. °Addiction cannot be cured but it can be stopped. This often requires outside help and the care of professionals. Treatment centers are listed in the yellow pages under: Cocaine, Narcotics, and Alcoholics Anonymous. Most hospitals and clinics can refer you to a specialized care center. Talk to your caregiver if you need help. °  °This information is not intended to replace advice given to you by your health care provider. Make sure you discuss any questions you have with your health care provider. °  °Document Released: 02/19/2005 Document Revised: 09/22/2011 Document Reviewed: 07/05/2014 °Elsevier Interactive Patient Education ©2016 [employer] ° °

## 2015-10-07 NOTE — ED Notes (Signed)
Regular breakfast tray ordered at 7:37am.

## 2015-10-07 NOTE — ED Notes (Signed)
BH assessment complete - recommendation made to hold patient overnight and re-evaluate in the morning. MD made aware.

## 2015-10-07 NOTE — ED Notes (Signed)
Psych at bedside assessing patient.

## 2015-10-07 NOTE — ED Notes (Signed)
Pt ambulatory with steady gait denies any concerns about discharge

## 2017-03-19 ENCOUNTER — Encounter (HOSPITAL_COMMUNITY): Payer: Self-pay | Admitting: Emergency Medicine

## 2017-03-19 ENCOUNTER — Emergency Department (HOSPITAL_COMMUNITY)
Admission: EM | Admit: 2017-03-19 | Discharge: 2017-03-20 | Disposition: A | Discharge status: Home / Self Care | Payer: Medicaid Other | Attending: Emergency Medicine | Admitting: Emergency Medicine

## 2017-03-19 DIAGNOSIS — F1610 Hallucinogen abuse, uncomplicated: Secondary | ICD-10-CM | POA: Insufficient documentation

## 2017-03-19 DIAGNOSIS — F17200 Nicotine dependence, unspecified, uncomplicated: Secondary | ICD-10-CM | POA: Diagnosis not present

## 2017-03-19 DIAGNOSIS — R531 Weakness: Secondary | ICD-10-CM | POA: Diagnosis present

## 2017-03-19 NOTE — ED Notes (Signed)
Pt had drawn in triage for labs: Gold Blue Lt green x2 Dark green x2 Lavender
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
## 2017-03-20 ENCOUNTER — Emergency Department (HOSPITAL_COMMUNITY): Payer: Medicaid Other

## 2017-03-20 ENCOUNTER — Emergency Department (HOSPITAL_COMMUNITY)
Admission: EM | Admit: 2017-03-20 | Discharge: 2017-03-20 | Disposition: A | Discharge status: Home / Self Care | Payer: Medicaid Other | Source: Home / Self Care | Attending: Emergency Medicine | Admitting: Emergency Medicine

## 2017-03-20 DIAGNOSIS — F1910 Other psychoactive substance abuse, uncomplicated: Secondary | ICD-10-CM

## 2017-03-20 LAB — COMPREHENSIVE METABOLIC PANEL
ALK PHOS: 76 U/L (ref 38–126)
ALT: 28 U/L (ref 17–63)
ALT: 22 U/L (ref 17–63)
ANION GAP: 10 (ref 5–15)
ANION GAP: 7 (ref 5–15)
AST: 35 U/L (ref 15–41)
AST: 27 U/L (ref 15–41)
Albumin: 4.6 g/dL (ref 3.5–5.0)
Albumin: 3.7 g/dL (ref 3.5–5.0)
Alkaline Phosphatase: 68 U/L (ref 38–126)
BILIRUBIN TOTAL: 1.1 mg/dL (ref 0.3–1.2)
BUN: 16 mg/dL (ref 6–20)
BUN: 17 mg/dL (ref 6–20)
CALCIUM: 9.7 mg/dL (ref 8.9–10.3)
CO2: 27 mmol/L (ref 22–32)
CO2: 26 mmol/L (ref 22–32)
Calcium: 8.6 mg/dL — ABNORMAL LOW (ref 8.9–10.3)
Chloride: 99 mmol/L — ABNORMAL LOW (ref 101–111)
Chloride: 103 mmol/L (ref 101–111)
Creatinine, Ser: 1.08 mg/dL (ref 0.61–1.24)
Creatinine, Ser: 0.9 mg/dL (ref 0.61–1.24)
GFR calc non Af Amer: >60 mL/min (ref >60)
Glucose, Bld: 101 mg/dL — ABNORMAL HIGH (ref 65–99)
Glucose, Bld: 88 mg/dL (ref 65–99)
Potassium: 3.9 mmol/L (ref 3.5–5.1)
Potassium: 3.9 mmol/L (ref 3.5–5.1)
SODIUM: 136 mmol/L (ref 135–145)
Sodium: 136 mmol/L (ref 135–145)
TOTAL PROTEIN: 7.6 g/dL (ref 6.5–8.1)
Total Bilirubin: 0.6 mg/dL (ref 0.3–1.2)
Total Protein: 6.2 g/dL — ABNORMAL LOW (ref 6.5–8.1)

## 2017-03-20 LAB — CBC WITH DIFFERENTIAL/PLATELET
BASOS PCT: 0 %
Basophils Absolute: 0 10*3/uL (ref 0.0–0.1)
EOS ABS: 0.5 10*3/uL (ref 0.0–0.7)
EOS PCT: 7 %
HCT: 41.7 % (ref 39.0–52.0)
Hemoglobin: 13.7 g/dL (ref 13.0–17.0)
LYMPHS PCT: 21 %
Lymphs Abs: 1.4 10*3/uL (ref 0.7–4.0)
MCH: 27.3 pg (ref 26.0–34.0)
MCHC: 32.9 g/dL (ref 30.0–36.0)
MCV: 83.2 fL (ref 78.0–100.0)
Monocytes Absolute: 0.6 10*3/uL (ref 0.1–1.0)
Monocytes Relative: 9 %
NEUTROS ABS: 4.1 10*3/uL (ref 1.7–7.7)
NEUTROS PCT: 63 %
PLATELETS: 265 10*3/uL (ref 150–400)
RBC: 5.01 MIL/uL (ref 4.22–5.81)
RDW: 13.9 % (ref 11.5–15.5)
WBC: 6.5 10*3/uL (ref 4.0–10.5)

## 2017-03-20 LAB — CBC
HCT: 44.8 % (ref 39.0–52.0)
Hemoglobin: 14.8 g/dL (ref 13.0–17.0)
MCH: 28 pg (ref 26.0–34.0)
MCHC: 33 g/dL (ref 30.0–36.0)
MCV: 84.8 fL (ref 78.0–100.0)
Platelets: 265 10*3/uL (ref 150–400)
RBC: 5.28 MIL/uL (ref 4.22–5.81)
RDW: 14.1 % (ref 11.5–15.5)
WBC: 9 10*3/uL (ref 4.0–10.5)

## 2017-03-20 LAB — CBG MONITORING, ED: Glucose-Capillary: 98 mg/dL (ref 65–99)

## 2017-03-20 LAB — ETHANOL: Alcohol, Ethyl (B): <5 mg/dL (ref <5)

## 2017-03-20 NOTE — Discharge Instructions (Signed)
You are given resources for detox programs.  You blood work is reassuring.  Get rest. Drink plenty of fluids. Return for worsening symptoms.

## 2017-03-20 NOTE — ED Notes (Signed)
Patient transported to X-ray 

## 2017-03-20 NOTE — ED Notes (Signed)
Pt ambulatory in hall without difficulty.  Pt reports he is "just so tired" and that's why he kept falling asleep.  Pt given clean clothes by chaplain. Pt given warm water to bathe with before dicharge

## 2017-03-20 NOTE — ED Provider Notes (Signed)
[facility] DEPT Provider Note   CSN: [id number] Arrival date & time: 03/20/17  1200     History   Chief Complaint Chief Complaint  Patient presents with  . Generalized Body Aches  . Knee Pain    HPI Scott Maddox is a 31 y.o. male.  HPI 31 year old male who presents with body aches and generalized weakness. He has a history of polysubstance abuse. Was seen in the emergency department yesterday evening to this morning after using MDMA and molly. States that he was not feeling well since then. States that this morning after discharge had nausea and vomiting and generalized body aches with weakness. Denies any additional substance abuse. Denies any suicidal or homicidal thoughts. Denies fevers, chest pain, difficulty breathing, abdominal pain, urinary symptoms.Also reports right knee pain, but denies any fall or head strike.   No past medical history on file.  Patient Active Problem List   Diagnosis Date Noted  . 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 Marijuana dependence (HCC) 10/07/2015    No past surgical history on file.     Home Medications    Prior to Admission medications   Not on File    Family History Family History  Problem Relation Age of Onset  . Diabetes Other   . Diabetes Brother     Social History Social History  Substance Use Topics  . Smoking status: Current Every Day Smoker  . Smokeless tobacco: Never Used  . Alcohol use No     Comment: denies      Allergies   Tylenol [acetaminophen]   Review of Systems Review of Systems  Constitutional: Positive for fatigue. Negative for fever.  Respiratory: Negative for shortness of breath.   Cardiovascular: Negative for chest pain.  Gastrointestinal: Positive for nausea and vomiting. Negative for abdominal pain.  All other systems reviewed and are negative.    Physical Exam Updated Vital Signs BP 113/68 (BP Location: Right Arm)   Pulse 70   Temp 97.9 F (36.6 C) (Oral)   Resp 18   SpO2 100%    Physical Exam Physical Exam  Nursing note and vitals reviewed. Constitutional: Well developed, well nourished, non-toxic, and in no acute distress Head: Normocephalic and atraumatic.  Mouth/Throat: Oropharynx is clear and moist.  Neck: Normal range of motion. Neck supple.  Cardiovascular: Normal rate and regular rhythm.   Pulmonary/Chest: Effort normal and breath sounds normal.  Abdominal: Soft. There is no tenderness. There is no rebound and no guarding.  Musculoskeletal: Normal range of motion.  Neurological: somnolent, arouses to voice and touch, oriented x 4, no facial droop, fluent speech, moves all extremities symmetrically, sensation to light touch in tact throughout Skin: Skin is warm and dry.  Psychiatric: Cooperative    ED Treatments / Results  Labs (all labs ordered are listed, but only abnormal results are displayed) Labs Reviewed  COMPREHENSIVE METABOLIC PANEL - Abnormal; Notable for the following:       Result Value   Calcium 8.6 (*)    Total Protein 6.2 (*)    All other components within normal limits  CBC WITH DIFFERENTIAL/PLATELET  ETHANOL  CBG MONITORING, ED    EKG  EKG Interpretation None       Radiology Dg Knee 2 Views Right  Result Date: 03/20/2017 CLINICAL DATA:  Right knee pain. Polysubstance abuse. No known injury. EXAM: RIGHT KNEE - 1-2 VIEW COMPARISON:  None. FINDINGS: Small medial femoral condyles bone island. Otherwise, normal appearing bones and soft tissues. No effusion seen. IMPRESSION: Normal  examination. Electronically Signed   By: Beckie Salts M.D.   On: 03/20/2017 13:19    Procedures Procedures (including critical care time)  Medications Ordered in ED Medications - No data to display   Initial Impression / Assessment and Plan / ED Course  I have reviewed the triage vital signs and the nursing notes.  Pertinent labs & imaging results that were available during my care of the patient were reviewed by me and considered in my  medical decision making (see chart for details).     Initially sleeping and somnolent, but easily arouses to voice. He is oriented 4. No focal neurological deficits. No signs of trauma. Suspect that he is coming down from recent [place] usage earlier that he was seen in the emergency department for. Exam is unremarkable. Blood work reassuring. My reevaluation he is awake, tolerating by mouth without difficulty. Appropriate for outpatient management. Outpatient drug rehab resources are provided. Strict return and follow-up instructions reviewed. He expressed understanding of all discharge instructions and felt comfortable with the plan of care.   Final Clinical Impressions(s) / ED Diagnoses   Final diagnoses:  Polysubstance abuse    New Prescriptions New Prescriptions   No medications on file     Lavera Guise, MD 03/20/17 1428

## 2017-03-20 NOTE — ED Notes (Signed)
ED Provider at bedside. 

## 2017-03-20 NOTE — ED Provider Notes (Signed)
[facility] Provider Note   CSN: [id number] Arrival date & time: 03/19/17  2005     History   Chief Complaint Chief Complaint  Patient presents with  . Addiction Problem    HPI Scott Maddox is a 31 y.o. male.  HPI Pt comes in with cc of drug problem. Pt has hx of MDMA abuse and was residing in a half-way home type facility. Pt reports that he used some molly last night. Since then he hasnt been feeling great. Pt denies nausea, emesis, fevers, chills, chest pains, shortness of breath, headaches, abdominal pain, uti like symptoms. He has no SI/HI. Pt  Reports having some hallucinations - but he gets them when he uses drugs.  History reviewed. No pertinent past medical history.  Patient Active Problem List   Diagnosis Date Noted  . 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 Marijuana dependence (HCC) 10/07/2015    History reviewed. No pertinent surgical history.     Home Medications    Prior to Admission medications   Not on File    Family History Family History  Problem Relation Age of Onset  . Diabetes Other   . Diabetes Brother     Social History Social History  Substance Use Topics  . Smoking status: Current Every Day Smoker  . Smokeless tobacco: Never Used  . Alcohol use No     Comment: denies      Allergies   Tylenol [acetaminophen]   Review of Systems Review of Systems  Constitutional: Positive for activity change.  Eyes: Negative for visual disturbance.  Respiratory: Negative for shortness of breath.   Cardiovascular: Negative for chest pain.  Gastrointestinal: Negative for nausea.  Psychiatric/Behavioral: Positive for behavioral problems and hallucinations. Negative for confusion and suicidal ideas. The patient is nervous/anxious.      Physical Exam Updated Vital Signs BP 115/70 (BP Location: Left Arm)   Pulse 68   Temp 98.2 F (36.8 C) (Oral)   Resp 18   SpO2 99%   Physical Exam  Constitutional: He is oriented to person, place, and  time. He appears well-developed.  HENT:  Head: Atraumatic.  Eyes: Pupils are equal, round, and reactive to light. EOM are normal.  No nystagmus  Neck: Neck supple.  Cardiovascular: Normal rate.   Pulmonary/Chest: Effort normal.  Abdominal: Soft. There is no tenderness.  Neurological: He is alert and oriented to person, place, and time. No cranial nerve deficit.  Skin: Skin is warm.  Nursing note and vitals reviewed.    ED Treatments / Results  Labs (all labs ordered are listed, but only abnormal results are displayed) Labs Reviewed  COMPREHENSIVE METABOLIC PANEL - Abnormal; Notable for the following:       Result Value   Chloride 99 (*)    Glucose, Bld 101 (*)    All other components within normal limits  ETHANOL  CBC  RAPID URINE DRUG SCREEN, HOSP PERFORMED    EKG  EKG Interpretation None       Radiology No results found.  Procedures Procedures (including critical care time)  Medications Ordered in ED Medications - No data to display   Initial Impression / Assessment and Plan / ED Course  I have reviewed the triage vital signs and the nursing notes.  Pertinent labs & imaging results that were available during my care of the patient were reviewed by me and considered in my medical decision making (see chart for details).  Clinical Course as of Mar 20 446  Fri Mar 20, 2017  0246 Pt 's exam is unchanged. He has been advised [facility]. Strict ER return precautions have been discussed, and patient is agreeing with the plan and is comfortable with the workup done and the recommendations from the ER.   [AN]    Clinical Course User Index [AN] Derwood KaplanNanavati, Kaidynce Pfister, MD    Pt comes in after abusing Kirt BoysMolly / MDMA. He is not sure what time he took the pills. Pt had arrived with tachycardia and confusion - but he has a normal HR and no confusion on my exam. Pt still reports having hallucination - but that is due to the drug use most likely. Pt has  no primary psych hx, and he has no si/hi. Pt has no nystagmus and no focal neuro deficits. We will watch him in the ER for an extended period of time, but I anticipate  Discharge.  Final Clinical Impressions(s) / ED Diagnoses   Final diagnoses:  MDMA abuse    New Prescriptions New Prescriptions   No medications on file     Derwood KaplanNanavati, Arianna Haydon, MD 03/20/17 [phone]

## 2017-03-20 NOTE — ED Notes (Signed)
Bed: WA01 Expected date:  Expected time:  Means of arrival:  Comments: EMS 31 y/o generalized weakness

## 2017-03-20 NOTE — ED Notes (Signed)
PT CBG 98

## 2017-03-20 NOTE — Discharge Instructions (Signed)
Substance Abuse Treatment Programs ° °Intensive Outpatient Programs °[facility]     °[address]      °[place], [place]                   °[phone]      ° °[facility] °[address] °[place], [place] °[phone] ° °[facility]     °(Inpatient and outpatient)     °[address]           °[phone]   ° °[facility] °[phone] (Suboxone and Methadone) ° °[address]      °[place], [place] [address]      °[phone]      ° °[address] °[place], [place] °852-3033 ° °[facility] (Outpatient/Inpatient, Chemical)    °(insurance only) [phone]      °       °[facility] (Groups & Residential) °[place], [place] °[phone] ° °   °[facility]     °[address]     °[place], [place]      °[phone]      ° °[facility] (for caregivers and family) °[address] °[place], [place] °336-299-4655 ° ° ° ° ° °Residential Treatment Programs °[facility]      °[address], [place], [place] [address]  °[phone]      ° °[facility] °[address] [place], [place] [address] °[phone] ° °[facility]        °[phone]      ° °[facility] °[phone] ° °[facility]             °[address]                                         °[place], [place]                                                °[phone] or 336-784-9470                              ° °[facility] [place] °[address] °[place], [address] °1.877.941.8954 ° °[facility]    °[address]      °[place], [place]     °[phone]      ° °[facility] °[address] °, [place] °336-285-9073 ° °[facility]   °[address]     °[place], [place] [address]     °[phone]      °Admissions: 8am-3pm M-F ° °[facility] °[address] °[place],  [place] °336-227-7417 ° °[facility]: Residential Program (90 Days)   °[place], [place]      °336-725-8389 or 800-758-6077    ° °[facility] °[place], [place] °(Walk in Hours over the weekend or by referral) ° °[facility] °[address], [place],  [address] °([phone] ° °Crisis Mobile: [facility]:  1-877-626-1772 (for crisis response 24 hours a day) °[facility] Hotline:      1-800-256-2452 °Outpatient Psychiatry and Counseling ° °[facility]: Mobile Crisis   Management 24 hours:  1-877-626-1772 ° °[facility] sliding scale fee and walk in schedule: M-F 8am-12pm/1pm-3pm °[address]  °[place], [place] [address] °[phone] ° °[facility] °[address] °[place], [place] [address] °336-703-9650 ° °[facility] ([facility])- new patient walk-in appointments available Monday - Friday 8am -3pm.          °[address] °[place], [place] [address] °336-676-6840 or crisis line- 336-676-6905 ° °[facility] Intensive Outpatient Therapy Program °[address] °[place], [place] [address] °336-832-9804 ° °[facility]                  °Crisis Services      °336.641.4993      °[address]     °[place], [place] [address]                ° °[facility]   °[facility] °[phone] °[address] °[place], [place] [address] ° ° °[facility]          °[address]  °[place], [place] [address]       °([phone] ° °[facility] °[address] °[place], [place] [address] °336-292-1510 ° °[facility]    °[address]    °[place], [place] [address]     °[phone]      ° °Parish McKinney, MD     °[address]     °[place] [address]     °336-282-1251     °  °[facility] °[address] °[place] [address] ° °[facility]     °[address]     °[place], [place]      °336-542-2076      ° °[facility] °Shamsher Ahluwalia, MD °[address] °[place], [place] [address] °[phone] ° °[facility]     °[address]     °[place], [place] [address]     °336-274-1237      ° °[facility] °[address] °[place], [place] [address] °336-854-4450 °Resources for Temporary Residential Assistance/Crisis Centers ° °[facility] °[facility] °M-F 8am-3pm   °[address] [place], [place] [address]   336-332-0824 °Services include: laundry, barbering, support groups, case management, phone  & computer access, showers, AA/NA mtgs, mental health/substance abuse nurse, job skills class, disability information, VA assistance, spiritual classes, etc.  ° °HOMELESS SHELTERS ° ° [facility]     °[facility]   °[address], [place]     °[phone]       °       °[facility] (women and children)       °[address] °, [place] [address] °336-275-0820 °[email or web address] for application and process °Application Required ° °[facility]   °[address]    °[place] [address]     °[phone]       °             °[facility] °[address] °, [place] [address] °336.273.5572 °336-235-0363(schedule application appt.) °Application Required ° °[facility] (women only)    °[address]     °[place],  [address]     °336-884-1039      °  Intake starts 6pm daily °Need valid ID, SSC, & Police report °[facility] [place] °[address] °[place], [place] °336-881-5420 °Application Required ° °[facility] (men only)     °[address]      °[place], [place]     °[phone]      ° °[facility] °(Pregnant women only) °[address] °[place], [place] °336-275-0206 ° °[facility]      °[address]      °[place], [place] [address]     °[phone]      °       °[facility] °[address] °[place], [place] °336-723-1848 °90 day commitment/SA/Application process ° °[facility](men only)     °[address]     °[place], [place]     °[phone]       °Check-in at 7pm     °       °[facility] °[address] °[place], [place] [address] °336-248-6684 °Men/Women/Women and Children must be there by 7 pm ° °[facility] °[place], [place] °336-722-8721                ° °

## 2017-03-20 NOTE — ED Triage Notes (Signed)
Per EMS: Pt coming from the streets. Pt was seen yesterday and reports to EMS that he feels like he is having withdrawals.  Pt reports pain in his right knee also.  Pt is lethargic and responds to voice.

## 2018-12-10 IMAGING — CR DG KNEE 1-2V*R*
2 series · 2 of 2 positions shown · non-contrast
Comparison: None.

CLINICAL DATA: Right knee pain. Polysubstance abuse. No known
injury.

EXAM:
RIGHT KNEE - 1-2 VIEW

[t knee ap right]
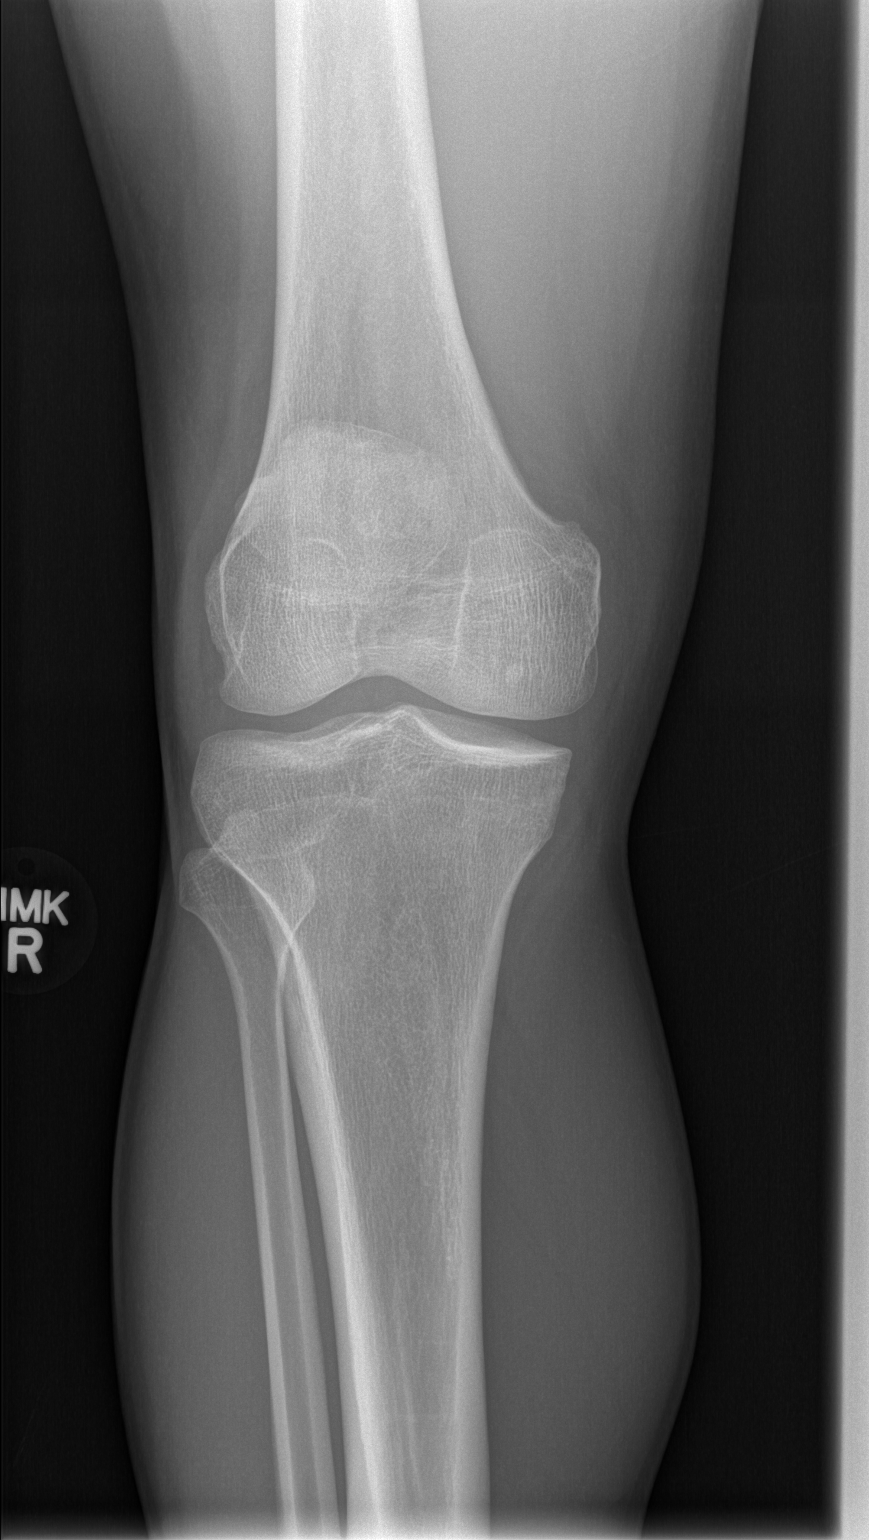

[t knee lat right]
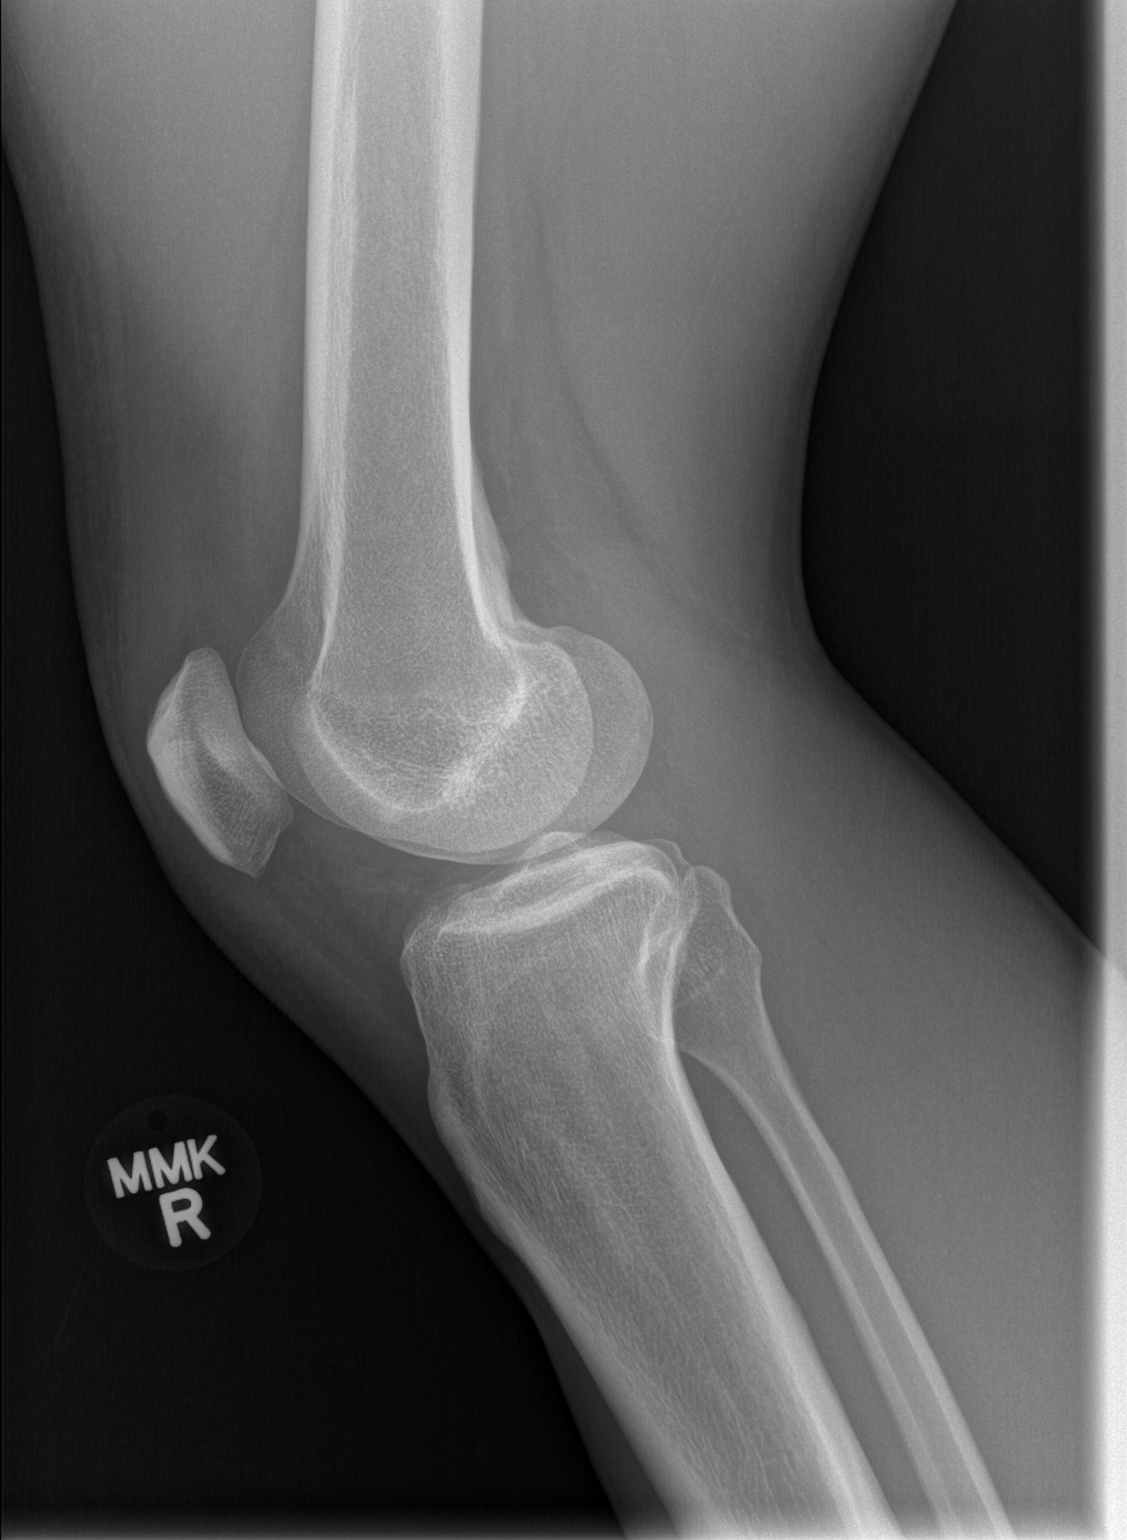

[2 of 2 positions shown; findings below may reference images not displayed]

FINDINGS: Small medial femoral condyles bone island. Otherwise, normal
appearing bones and soft tissues. No effusion seen.
IMPRESSION: Normal examination.

## 2022-12-18 ENCOUNTER — Encounter (HOSPITAL_COMMUNITY): Payer: Self-pay

## 2022-12-18 ENCOUNTER — Ambulatory Visit (HOSPITAL_COMMUNITY)
Admission: EM | Admit: 2022-12-18 | Discharge: 2022-12-18 | Disposition: A | Discharge status: Home / Self Care | Payer: Medicaid Other | Attending: Emergency Medicine | Admitting: Emergency Medicine

## 2022-12-18 DIAGNOSIS — G44219 Episodic tension-type headache, not intractable: Secondary | ICD-10-CM

## 2022-12-18 MED ORDER — NAPROXEN 375 MG PO TABS: 375.0000 mg | ORAL_TABLET | Freq: Two times a day (BID) | ORAL | 0 refills | Status: AC

## 2022-12-18 NOTE — ED Notes (Signed)
Attempted to call for patient in lobby x 1, no answer Registration states they do not see patient in lobby

## 2022-12-18 NOTE — ED Triage Notes (Signed)
Pt states woke up this am with a migraine and unable to work. Requesting a work note. States this is the 3 time this week he woke up with a migraine. Denies headache at this time.

## 2022-12-18 NOTE — Discharge Instructions (Signed)
Make sure you are drinking plenty of fluid.  Eat small frequent meals.  I recommend an over-the-counter magnesium supplement to help prevent headaches.  Take Naprosyn when you have a headache.  Do not take NSAIDs with this medication including aspirin, ibuprofen/Advil, naproxen/Aleve.  I think you should follow-up with primary care to determine if you need to see a neurologist.  Someone should reach out to you to help schedule an appointment.  If you have any worsening symptoms including sudden severe headache, worsening of your life, difficulty speaking, difficulty swallowing, weakness, nausea, vomiting, vision change you should be seen immediately.

## 2022-12-18 NOTE — ED Provider Notes (Signed)
[facility]    CSN: [id number] Arrival date & time: 12/18/22  1512      History   Chief Complaint Chief Complaint  Patient presents with   Letter for School/Work    HPI Scott Maddox is a 37 y.o. male.   Patient presents today with a several day history of intermittent headaches.  He reports that for the past few days he has woken up with a headache but then these have resolved with over-the-counter analgesics.  He denies formal diagnosis of primary headache disorder but has had intermittent headaches throughout his life.  Reports his mother has a history of migraines.  He denies any recent head trauma, medication changes, recent illness.  Denies additional symptoms including cough, congestion, fever, nausea, vomiting.  Denies any associated focal weakness, dysarthria, visual disturbance.  He has missed work as result of symptoms and is requesting a work excuse note.  During episodes pain is rated 8 on a 0-10 pain scale, described as throbbing with periodic sharp pains, localized to bitemporal region, no alleviating factors identified.  Denies previous workup for headaches.  His symptoms have resolved and he is currently asymptomatic.    History reviewed. No pertinent past medical history.  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 No pertinent surgical history.     Home Medications    Prior to Admission medications   Medication Sig Start Date End Date Taking? Authorizing Provider  naproxen (NAPROSYN) 375 MG tablet Take 1 tablet (375 mg total) by mouth 2 (two) times daily. 12/18/22  Yes Hanae Waiters, Noberto Retort, PA-C    Family History Family History  Problem Relation Age of Onset   Diabetes Other    Diabetes Brother     Social History Social History   Tobacco Use   Smoking status: Every Day   Smokeless tobacco: Never  Substance Use Topics   Alcohol use: No     Comment: denies    Drug use: Yes    Types: Marijuana    Comment: molly     Allergies   Tylenol [acetaminophen]   Review of Systems Review of Systems  Constitutional:  Positive for activity change. Negative for appetite change, fatigue and fever.  HENT:  Negative for congestion and sore throat.   Eyes:  Negative for visual disturbance.  Respiratory:  Negative for cough and shortness of breath.   Cardiovascular:  Negative for chest pain.  Gastrointestinal:  Negative for abdominal pain, diarrhea, nausea and vomiting.  Musculoskeletal:  Negative for arthralgias and myalgias.  Neurological:  Positive for headaches. Negative for dizziness, syncope, facial asymmetry, speech difficulty, weakness, light-headedness and numbness.     Physical Exam Triage Vital Signs ED Triage Vitals  Enc Vitals Group     BP 12/18/22 1643 114/74     Pulse Rate 12/18/22 1643 83     Resp 12/18/22 1643 18     Temp 12/18/22 1643 98 F (36.7 C)     Temp Source 12/18/22 1643 Oral     SpO2 12/18/22 1643 96 %     Weight --      Height --      Head Circumference --      Peak Flow --      Pain Score 12/18/22 1644 0     Pain Loc --      Pain Edu? --      Excl. in  GC? --    No data found.  Updated Vital Signs BP 114/74 (BP Location: Left Arm)   Pulse 83   Temp 98 F (36.7 C) (Oral)   Resp 18   SpO2 96%   Visual Acuity Right Eye Distance:   Left Eye Distance:   Bilateral Distance:    Right Eye Near:   Left Eye Near:    Bilateral Near:     Physical Exam Vitals reviewed.  Constitutional:      General: He is awake.     Appearance: Normal appearance. He is well-developed. He is not ill-appearing.     Comments: Very pleasant male appears stated age in no acute distress sitting comfortably in exam room  HENT:     Head: Normocephalic and atraumatic.     Right Ear: Tympanic membrane, ear canal and external ear normal. No hemotympanum.     Left Ear: Tympanic membrane, ear canal and external ear  normal. No hemotympanum.     Nose: Nose normal.     Mouth/Throat:     Pharynx: Uvula midline. No oropharyngeal exudate or posterior oropharyngeal erythema.  Eyes:     Extraocular Movements: Extraocular movements intact.     Conjunctiva/sclera: Conjunctivae normal.     Pupils: Pupils are equal, round, and reactive to light.  Cardiovascular:     Rate and Rhythm: Normal rate and regular rhythm.     Heart sounds: Normal heart sounds, S1 normal and S2 normal. No murmur heard. Pulmonary:     Effort: Pulmonary effort is normal. No accessory muscle usage or respiratory distress.     Breath sounds: Normal breath sounds. No stridor. No wheezing, rhonchi or rales.     Comments: Clear to auscultation bilaterally Musculoskeletal:     Comments: Strength 5/5 bilateral upper and lower extremities  Neurological:     General: No focal deficit present.     Mental Status: He is alert and oriented to person, place, and time.     Cranial Nerves: Cranial nerves 2-12 are intact.     Motor: Motor function is intact.     Coordination: Coordination is intact.     Gait: Gait is intact.     Comments: No focal neurological defect on exam.  Psychiatric:        Behavior: Behavior is cooperative.      UC Treatments / Results  Labs (all labs ordered are listed, but only abnormal results are displayed) Labs Reviewed - No data to display  EKG   Radiology No results found.  Procedures Procedures (including critical care time)  Medications Ordered in UC Medications - No data to display  Initial Impression / Assessment and Plan / UC Course  I have reviewed the triage vital signs and the nursing notes.  Pertinent labs & imaging results that were available during my care of the patient were reviewed by me and considered in my medical decision making (see chart for details).     Patient is well-appearing, afebrile, nontoxic, nontachycardic.  Vital signs and physical exam are reassuring with no  indication for emergent evaluation or imaging.  Suspect tension headache as etiology of symptoms.  Patient was encouraged to eat small frequent meals and push fluids.  Also recommended over-the-counter magnesium supplement as prophylaxis.  He does not currently have a primary care so we will try to establish him with primary care via PCP assistance.  Was given Naprosyn for pain relief during acute episodes with instruction not to take additional NSAIDs with this medication  due to risk of GI bleeding.  He was provided a work excuse note as requested.  Discussed that if he continues to have recurrent headaches he should be evaluated by neurologist and recommended follow-up with PCP to determine if this is appropriate.  Discussed that if he has any worsening symptoms including sudden severe headache, worst headache of his life, dysarthria, focal weakness, nausea, vomiting he needs to be seen immediately.  Strict return precautions given.  Final Clinical Impressions(s) / UC Diagnoses   Final diagnoses:  Episodic tension-type headache, not intractable     Discharge Instructions      Make sure you are drinking plenty of fluid.  Eat small frequent meals.  I recommend an over-the-counter magnesium supplement to help prevent headaches.  Take Naprosyn when you have a headache.  Do not take NSAIDs with this medication including aspirin, ibuprofen/Advil, naproxen/Aleve.  I think you should follow-up with primary care to determine if you need to see a neurologist.  Someone should reach out to you to help schedule an appointment.  If you have any worsening symptoms including sudden severe headache, worsening of your life, difficulty speaking, difficulty swallowing, weakness, nausea, vomiting, vision change you should be seen immediately.     ED Prescriptions     Medication Sig Dispense Auth. Provider   naproxen (NAPROSYN) 375 MG tablet Take 1 tablet (375 mg total) by mouth 2 (two) times daily. 20 tablet  Kalub Morillo, Noberto Retort, PA-C      PDMP not reviewed this encounter.   Jeani Hawking, PA-C 12/18/22 1716

## 2024-06-21 ENCOUNTER — Ambulatory Visit (HOSPITAL_COMMUNITY)
Admission: EM | Admit: 2024-06-21 | Discharge: 2024-06-21 | Disposition: A | Discharge status: Home / Self Care | Attending: Family Medicine | Admitting: Family Medicine

## 2024-06-21 ENCOUNTER — Encounter (HOSPITAL_COMMUNITY): Payer: Self-pay

## 2024-06-21 DIAGNOSIS — Z0130 Encounter for examination of blood pressure without abnormal findings: Secondary | ICD-10-CM

## 2024-06-21 NOTE — ED Triage Notes (Signed)
 Patient states he went to donate Plasma 4 days ago and was told his BP was 175/121. patient denies dizziness or headache.

## 2024-06-21 NOTE — ED Provider Notes (Signed)
  [facility]   [id number] 06/21/24 Arrival Time: 1248  ASSESSMENT & PLAN:  1. Blood pressure check    BP 128/82 (BP Location: Right Arm)   Temp 97.8 F (36.6 C) (Oral)   Resp 16   SpO2 97%   Paper signed stating that his BP was within normal limits here today.   Follow-up Information     [facility].   Specialty: Urgent Care Why: As needed. Contact information: [address] [place]  [address] [phone]                Reviewed expectations re: course of current medical issues. Questions answered. Outlined signs and symptoms indicating need for more acute intervention. Patient verbalized understanding. After Visit Summary given.   SUBJECTIVE:  Scott Maddox is a 38 y.o. male who presents with concerns regarding increased blood pressures. Going to donate plasma today; told him his BP was high. Not treated for HTN.175/121. Denies any symptoms.   Social History   Tobacco Use  Smoking Status Former   Types: Cigarettes  Smokeless Tobacco Never     OBJECTIVE:  Vitals:   06/21/24 1457  BP: 128/82  Resp: 16  Temp: 97.8 F (36.6 C)  TempSrc: Oral  SpO2: 97%    General appearance: alert; no distress Psychological: alert and cooperative; normal mood and affect  ECG: No orders found for this or any previous visit.  Labs:  Labs Reviewed - No data to display  Imaging: No results found.  Allergies  Allergen Reactions   Tylenol  [Acetaminophen ] Rash    Tylenol  no. 3    History reviewed. No pertinent past medical history. Social History   Socioeconomic History   Marital status: Single    Spouse name: Not on file   Number of children: Not on file   Years of education: Not on file   Highest education level: Not on file  Occupational History   Not on file  Tobacco Use   Smoking status: Former    Types: Cigarettes   Smokeless tobacco: Never  Vaping Use   Vaping status: Never  Used  Substance and Sexual Activity   Alcohol use: No    Comment: denies    Drug use: Yes    Types: Marijuana   Sexual activity: Not on file  Other Topics Concern   Not on file  Social History Narrative   Not on file   Social Drivers of Health   Financial Resource Strain: Not on file  Food Insecurity: Not on file  Transportation Needs: Not on file  Physical Activity: Not on file  Stress: Not on file  Social Connections: Not on file  Intimate Partner Violence: Not on file   Family History  Problem Relation Age of Onset   Diabetes Brother    Diabetes Other    History reviewed. No pertinent surgical history.     Rolinda Rogue, MD 06/21/24 [phone]
